# Patient Record
Sex: Female | Born: 1945 | Race: White | Hispanic: No
Health system: Southern US, Community
[De-identification: ages and names within clinical notes are randomized; demographics above are authoritative.]

---

## 2013-04-02 DIAGNOSIS — R911 Solitary pulmonary nodule: Secondary | ICD-10-CM | POA: Insufficient documentation

## 2018-10-17 ENCOUNTER — Ambulatory Visit (INDEPENDENT_AMBULATORY_CARE_PROVIDER_SITE_OTHER): Payer: Medicare Other | Admitting: Podiatry

## 2018-10-17 ENCOUNTER — Encounter: Payer: Self-pay | Admitting: Podiatry

## 2018-10-17 DIAGNOSIS — C3490 Malignant neoplasm of unspecified part of unspecified bronchus or lung: Secondary | ICD-10-CM | POA: Insufficient documentation

## 2018-10-17 DIAGNOSIS — E785 Hyperlipidemia, unspecified: Secondary | ICD-10-CM | POA: Insufficient documentation

## 2018-10-17 DIAGNOSIS — F32A Depression, unspecified: Secondary | ICD-10-CM | POA: Insufficient documentation

## 2018-10-17 DIAGNOSIS — K649 Unspecified hemorrhoids: Secondary | ICD-10-CM | POA: Insufficient documentation

## 2018-10-17 DIAGNOSIS — A310 Pulmonary mycobacterial infection: Secondary | ICD-10-CM | POA: Insufficient documentation

## 2018-10-17 DIAGNOSIS — I10 Essential (primary) hypertension: Secondary | ICD-10-CM | POA: Insufficient documentation

## 2018-10-17 DIAGNOSIS — R319 Hematuria, unspecified: Secondary | ICD-10-CM | POA: Insufficient documentation

## 2018-10-17 DIAGNOSIS — N952 Postmenopausal atrophic vaginitis: Secondary | ICD-10-CM | POA: Insufficient documentation

## 2018-10-17 DIAGNOSIS — F419 Anxiety disorder, unspecified: Secondary | ICD-10-CM | POA: Insufficient documentation

## 2018-10-17 DIAGNOSIS — F329 Major depressive disorder, single episode, unspecified: Secondary | ICD-10-CM | POA: Insufficient documentation

## 2018-10-17 DIAGNOSIS — M5090 Cervical disc disorder, unspecified, unspecified cervical region: Secondary | ICD-10-CM | POA: Insufficient documentation

## 2018-10-17 DIAGNOSIS — B070 Plantar wart: Secondary | ICD-10-CM | POA: Diagnosis not present

## 2018-10-19 NOTE — Progress Notes (Signed)
Subjective:   Patient ID: Laura Nguyen, female   DOB: 72 y.o.   MRN: [id number]   HPI 72 year old female presents the office today for concerns of a skin lesion to the bottom of her left foot.  She states that she was previously treated with " beetle juice" and helped quite a bit.  She then later had the wart removed and it came back on portion.  She was told that by her other doctor they did not have the medication she has not had any recent treatment.  There is discomfort with pressure.  Denies any redness or drainage.  She gets occasional swelling to the area.  No redness or warmth.   Review of Systems  All other systems reviewed and are negative.  No past medical history on file.     Current Outpatient Medications:  .  cyclobenzaprine (FLEXERIL) 5 MG tablet, cyclobenzaprine 5 mg tablet, Disp: , Rfl:  .  DULoxetine (CYMBALTA) 30 MG capsule, duloxetine 30 mg capsule,delayed release, Disp: , Rfl:  .  ezetimibe (ZETIA) 10 MG tablet, ezetimibe 10 mg tablet, Disp: , Rfl:  .  hydrocortisone (ANUSOL-HC) 2.5 % rectal cream, Anusol-HC 2.5 % rectal cream with applicator  Insert by rectal route., Disp: , Rfl:  .  hydrOXYzine (ATARAX/VISTARIL) 25 MG tablet, hydroxyzine HCl 25 mg tablet, Disp: , Rfl:  .  imiquimod (ALDARA) 5 % cream, APPLY TO AFFECTED AREAS ON MONDAY, WEDNESDAY AND FRIDAY AS DIRECTED, Disp: , Rfl:  .  pneumococcal 13-valent conjugate vaccine (PREVNAR 13) SUSP injection, ADM 0.5ML IM UTD, Disp: , Rfl:  .  telmisartan (MICARDIS) 80 MG tablet, telmisartan 80 mg tablet, Disp: , Rfl:  .  ALPRAZolam (XANAX) 0.5 MG tablet, alprazolam 0.5 mg tabs, Disp: , Rfl:  .  amLODipine (NORVASC) 2.5 MG tablet, amlodipine besylate 2.5 mg tabs, Disp: , Rfl:  .  atorvastatin (LIPITOR) 40 MG tablet, atorvastatin calcium 40 mg tabs, Disp: , Rfl:  .  azithromycin (ZITHROMAX) 250 MG tablet, azithromycin 250 mg tabs, Disp: , Rfl:  .  FLUAD 0.5 ML SUSY, ADM 0.5ML IM UTD, Disp: , Rfl: 0 .   hydrochlorothiazide (HYDRODIURIL) 12.5 MG tablet, hydrochlorothiazide 12.5 mg tabs, Disp: , Rfl:  .  metoprolol succinate (TOPROL-XL) 25 MG 24 hr tablet, metoprolol succinate ER 25 mg tablet,extended release 24 hr, Disp: , Rfl:   Allergies  Allergen Reactions  . Codeine Nausea Only  . Lisinopril Rash  . Oxycodone-Acetaminophen Nausea Only and Nausea And Vomiting    Other reaction(s): Vomiting   . Sulfa Antibiotics Nausea Only and Rash         Objective:  Physical Exam  General: AAO x3, NAD  Dermatological: Hyperkeratotic lesion left foot submetatarsal 3 area.  There is discomfort to palpation from the area and upon debridement there is evidence of recurrent verruca.  She had quite a bit of hyperkeratotic tissue along the area as well.  Minimal edema there is no surrounding erythema, fluctuation or crepitation and there is no ascending cellulitis.  No other lesions identified  Vascular: Dorsalis Pedis artery and Posterior Tibial artery pedal pulses are 2/4 bilateral with immedate capillary fill time. P. There is no pain with calf compression, swelling, warmth, erythema.   Neruologic: Grossly intact via light touch bilateral.  Protective threshold with Semmes Wienstein monofilament intact to all pedal sites bilateral.   Musculoskeletal: Tenderness the hyperkeratotic lesion but no other areas of tenderness.  Muscular strength 5/5 in all groups tested bilateral.  Gait: Unassisted, Nonantalgic.  Assessment:   Skin lesion left foot likely verruca     Plan:  -Treatment options discussed including all alternatives, risks, and complications -Etiology of symptoms were discussed -Lesion was sharply debrided without any complications.  The area was cleaned with alcohol.  Cantharone was applied followed by an occlusive bandage.  Post procedure instructions were discussed.  Monitoring signs or symptoms of infection.  Offloading pads were dispensed.  Trula Slade DPM

## 2018-11-06 ENCOUNTER — Ambulatory Visit: Payer: Medicare Other | Admitting: Podiatry

## 2018-11-07 ENCOUNTER — Encounter: Payer: Self-pay | Admitting: Podiatry

## 2018-11-07 ENCOUNTER — Ambulatory Visit (INDEPENDENT_AMBULATORY_CARE_PROVIDER_SITE_OTHER): Payer: Medicare Other | Admitting: Podiatry

## 2018-11-07 DIAGNOSIS — B070 Plantar wart: Secondary | ICD-10-CM

## 2018-11-07 NOTE — Patient Instructions (Signed)
Take dressing off in 8 hours and wash the foot with soap and water. If it is hurting or becomes uncomfortable before the 8 hours, go ahead and remove the bandage and wash the area.  If it blisters, apply antibiotic ointment and a band-aid.  Monitor for any signs/symptoms of infection. Call the office immediately if any occur or go directly to the emergency room. Call with any questions/concerns.   

## 2018-11-11 NOTE — Progress Notes (Signed)
Subjective: 72 year old female presents the office today for Evaluation for keratosis, verruca left submetatarsal area.  She says it was somewhat sore after the treatment with the [facility] last appointment but otherwise is doing better but is still present.  Denies any redness or drainage or any swelling.  No other concerns no changes the last saw her. Denies any systemic complaints such as fevers, chills, nausea, vomiting. No acute changes since last appointment, and no other complaints at this time.   Objective: AAO x3, NAD DP/PT pulses palpable bilaterally, CRT less than 3 seconds Hyperkeratotic lesion left foot submetatarsal 3 area.  He appears to be porokeratosis overall today improved.  There is no edema, erythema.  There is no drainage or pus. No open lesions or pre-ulcerative lesions.  No pain with calf compression, swelling, warmth, erythema  Assessment: Skin lesion left foot  Plan: -All treatment options discussed with the patient including all alternatives, risks, complications.  -The lesion was cleaned with alcohol and is debrided with a #312 with scalpel down to healthy tissue.  All areas cleaned with alcohol and Cantharone was applied followed by an occlusive bandage.  Post procedure sessions were discussed.  Monitor for infection. -RTC 3 weeks or sooner if needed -Patient encouraged to call the office with any questions, concerns, change in symptoms.   Trula Slade DPM

## 2018-12-01 ENCOUNTER — Encounter: Payer: Self-pay | Admitting: Podiatry

## 2018-12-01 ENCOUNTER — Ambulatory Visit (INDEPENDENT_AMBULATORY_CARE_PROVIDER_SITE_OTHER): Payer: Medicare Other | Admitting: Podiatry

## 2018-12-01 DIAGNOSIS — Q828 Other specified congenital malformations of skin: Secondary | ICD-10-CM | POA: Diagnosis not present

## 2018-12-01 NOTE — Progress Notes (Signed)
Subjective: 72 year old female presents the office today for evaluation for porokeratosis left foot. She states that she is doing well.  She was to have another treatment of the cancer care today as she is doing much better.  She denies any complications of the last treatment.  Denies any redness or drainage or any swelling.  She has no other concerns. Denies any systemic complaints such as fevers, chills, nausea, vomiting. No acute changes since last appointment, and no other complaints at this time.   Objective: AAO x3, NAD DP/PT pulses palpable bilaterally, CRT less than 3 seconds Hyperkeratotic lesion left foot submetatarsal 3 area. After debridement the area appears to be much improved and the skin is intact.  Very minimal amount of the lesion remains.  There is no edema, erythema.  There is no drainage or pus. No open lesions or pre-ulcerative lesions.  No pain with calf compression, swelling, warmth, erythema  Assessment: Skin lesion left foot-with improvement  Plan: -All treatment options discussed with the patient including all alternatives, risks, complications.  -The lesion was cleaned with alcohol and is debrided with a #312 with scalpel down to healthy tissue.  All areas cleaned with alcohol and Cantharone was applied followed by an occlusive bandage.  Post procedure sessions were discussed.  Monitor for infection. -RTC 3 weeks or sooner if needed -Patient encouraged to call the office with any questions, concerns, change in symptoms.   Trula Slade DPM

## 2018-12-04 ENCOUNTER — Ambulatory Visit: Payer: Medicare Other | Admitting: Podiatry

## 2018-12-25 ENCOUNTER — Ambulatory Visit: Payer: Medicare Other | Admitting: Podiatry

## 2020-07-14 ENCOUNTER — Ambulatory Visit: Payer: Medicare Other | Admitting: Podiatry

## 2021-08-09 ENCOUNTER — Ambulatory Visit: Payer: Medicare Other | Admitting: Podiatry

## 2021-08-15 ENCOUNTER — Encounter: Payer: Self-pay | Admitting: Podiatry

## 2021-08-15 ENCOUNTER — Other Ambulatory Visit: Payer: Self-pay

## 2021-08-15 ENCOUNTER — Ambulatory Visit (INDEPENDENT_AMBULATORY_CARE_PROVIDER_SITE_OTHER): Payer: Medicare Other | Admitting: Podiatry

## 2021-08-15 DIAGNOSIS — B079 Viral wart, unspecified: Secondary | ICD-10-CM | POA: Diagnosis not present

## 2021-08-15 NOTE — Patient Instructions (Signed)
Take dressing off in 8 hours and wash the foot with soap and water. If it is hurting or becomes uncomfortable before the 8 hours, go ahead and remove the bandage and wash the area.  If it blisters, apply antibiotic ointment and a band-aid.  Monitor for any signs/symptoms of infection. Call the office immediately if any occur or go directly to the emergency room. Call with any questions/concerns.   

## 2021-08-22 NOTE — Progress Notes (Signed)
Subjective: 75 year old female presents the office today for concerns of reoccurrence of warts on the bottom of her left foot.  She states they did go away however they have started come back.  No significant pain with them and denies any swelling or redness or any drainage.  No recent treatment otherwise.  No other concerns.  Objective: AAO x3, NAD DP/PT pulses palpable bilaterally, CRT less than 3 seconds On the plantar aspect left foot are 2 hyperkeratotic lesions with pinpoint bleeding evidence of verruca.  There is no edema, erythema, drainage or pus or any signs of infection.  No significant pain.  No foreign body or puncture wound noted.  No open lesions or pre-ulcerative lesions.  No pain with calf compression, swelling, warmth, erythema  Assessment: Verruca  Plan: -All treatment options discussed with the patient including all alternatives, risks, complications.  -Skin lesions were cleaned.  This sharply debrided without any complications.  Cantharone was applied followed an occlusive bandage.  Post procedure instructions discussed.  Monitor for any signs or symptoms of infection. -Patient encouraged to call the office with any questions, concerns, change in symptoms.   Return in about 4 weeks (around 09/12/2021).  Trula Slade DPM

## 2021-09-12 ENCOUNTER — Ambulatory Visit (INDEPENDENT_AMBULATORY_CARE_PROVIDER_SITE_OTHER): Payer: Medicare Other | Admitting: Podiatry

## 2021-09-12 ENCOUNTER — Other Ambulatory Visit: Payer: Self-pay

## 2021-09-12 DIAGNOSIS — B079 Viral wart, unspecified: Secondary | ICD-10-CM

## 2021-09-12 NOTE — Patient Instructions (Signed)
Keep the bandage on for 24 hours. At that time, remove and clean with soap and water. If it hurts or burns before 24 hours go ahead and remove the bandage and wash with soap and water. Keep the area clean. If there is any blistering cover with antibiotic ointment and a bandage. Monitor for any redness, drainage, or other signs of infection. Call the office if any are to occur. If you have any questions, please call the office at [phone].  

## 2021-09-17 NOTE — Progress Notes (Signed)
Subjective: 75 year old female presents the office today for follow evaluation of wart on the left foot.  She had 2 lesions at last appointment and 1 has resolved 1 is still remaining.  No pain, swelling or redness or any drainage.  She has no other concerns.  Objective: AAO x3, NAD DP/PT pulses palpable bilaterally, CRT less than 3 seconds On the left foot submetatarsal is 1 area of hyperkeratotic tissue with central verrucoid noted.  There is no edema, erythema or any sign of any signs of infection.  Fifth of lesion appears to be resolved.  No other open lesions were skin lesions noted. No pain with calf compression, swelling, warmth, erythema  Assessment: Verruca x1 left foot  Plan: -All treatment options discussed with the patient including all alternatives, risks, complications.  -Sharply debrided lesion without any complications or bleeding.  Does appear to be better but still evident and there is more callus formation.  Today I applied salicylic acid.  I sharply debrided the lesion without any complications or bleeding.  Skin was cleaned with alcohol and a pad was placed followed by salicylic acid and a bandage.  Post procedure instructions discussed. -Patient encouraged to call the office with any questions, concerns, change in symptoms.   Trula Slade DPM

## 2021-12-25 ENCOUNTER — Other Ambulatory Visit: Payer: Self-pay

## 2021-12-25 ENCOUNTER — Emergency Department (HOSPITAL_BASED_OUTPATIENT_CLINIC_OR_DEPARTMENT_OTHER): Payer: Medicare Other | Admitting: Radiology

## 2021-12-25 ENCOUNTER — Encounter (HOSPITAL_BASED_OUTPATIENT_CLINIC_OR_DEPARTMENT_OTHER): Payer: Self-pay | Admitting: Obstetrics and Gynecology

## 2021-12-25 ENCOUNTER — Emergency Department (HOSPITAL_BASED_OUTPATIENT_CLINIC_OR_DEPARTMENT_OTHER)
Admission: EM | Admit: 2021-12-25 | Discharge: 2021-12-25 | Disposition: A | Discharge status: Home / Self Care | Payer: Medicare Other | Attending: Emergency Medicine | Admitting: Emergency Medicine

## 2021-12-25 DIAGNOSIS — S6991XA Unspecified injury of right wrist, hand and finger(s), initial encounter: Secondary | ICD-10-CM | POA: Diagnosis present

## 2021-12-25 DIAGNOSIS — S66911A Strain of unspecified muscle, fascia and tendon at wrist and hand level, right hand, initial encounter: Secondary | ICD-10-CM | POA: Insufficient documentation

## 2021-12-25 DIAGNOSIS — I10 Essential (primary) hypertension: Secondary | ICD-10-CM | POA: Diagnosis not present

## 2021-12-25 DIAGNOSIS — Z79899 Other long term (current) drug therapy: Secondary | ICD-10-CM | POA: Insufficient documentation

## 2021-12-25 DIAGNOSIS — Z85118 Personal history of other malignant neoplasm of bronchus and lung: Secondary | ICD-10-CM | POA: Insufficient documentation

## 2021-12-25 DIAGNOSIS — W19XXXA Unspecified fall, initial encounter: Secondary | ICD-10-CM | POA: Diagnosis not present

## 2021-12-25 NOTE — ED Triage Notes (Signed)
Patient reports she tripped last night and hurt her right wrist. Patient reports she did not hit her head, no LOC. Patient reports her right wrist is swollen and painful to touch

## 2021-12-25 NOTE — Discharge Instructions (Addendum)
Your xray was negative for signs of fracture today, so you likely have some bruising and a wrist strain. Apply a compressive ACE bandage - you can pick up a wrist sleeve at [facility] that may be more comfortable for you.Rest and elevate the affected painful area.  Apply cold compresses intermittently as needed for approximately 15-20 minutes as a time. Take Tylenol and motrin as needed for pain. You can use Aleve instead of motrin, but please don't use both at the same time. As pain recedes, begin normal activities slowly as tolerated.  Call if symptoms persist. You can have this rechecked at your PCP appointment next week.

## 2021-12-27 NOTE — ED Provider Notes (Signed)
[facility] EMERGENCY DEPT Provider Note   CSN: [id number] Arrival date & time: 12/25/21  1224     History Chief Complaint  Patient presents with   Lytle Michaels    Laura Nguyen is a 75 y.o. female who presents for evaluation of a right wrist injury that occurred last night.  [place] injury.  Patient has decreased range of motion secondary to pain, she is concerned that it is swollen painful touch.  She denies tendon injury or loss of consciousness.  She denies numbness, tingling.  Pain worse with movement.  She has taken ibuprofen without relief.  She has no other complaints.   [place] Pertinent negatives include no abdominal pain, no headaches and no shortness of breath.      History reviewed. No pertinent past medical history.  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 No pertinent surgical history.   OB History   No obstetric history on file.     No family history on file.  Social History   Tobacco Use   Smoking status: Unknown   Smokeless tobacco: Never  Vaping Use   Vaping Use: Never used  Substance Use Topics   Alcohol use: Yes    Comment: Social   Drug use: Never    Home Medications Prior to Admission medications   Medication Sig Start Date End Date Taking? Authorizing Provider  ALPRAZolam (XANAX) 0.5 MG tablet alprazolam 0.5 mg tabs    [provider]  amLODipine (NORVASC) 2.5 MG tablet amlodipine besylate 2.5 mg tabs    [provider]  atorvastatin (LIPITOR) 40 MG tablet atorvastatin calcium 40 mg tabs    [provider]  cyclobenzaprine (FLEXERIL) 5  MG tablet cyclobenzaprine 5 mg tablet 02/17/18   [provider]  DULoxetine (CYMBALTA) 30 MG capsule duloxetine 30 mg capsule,delayed release 02/17/18   [provider]  ezetimibe (ZETIA) 10 MG tablet ezetimibe 10 mg tablet 02/28/09   [provider]  FLUAD 0.5 ML SUSY ADM 0.5ML IM UTD 09/10/18   [provider]  hydrochlorothiazide (HYDRODIURIL) 12.5 MG tablet hydrochlorothiazide 12.5 mg tabs    [provider]  hydrOXYzine (ATARAX/VISTARIL) 25 MG tablet hydroxyzine HCl 25 mg tablet 02/17/18   [provider]  imiquimod (ALDARA) 5 % cream APPLY TO AFFECTED AREAS ON MONDAY, [place] AS DIRECTED 04/28/18   [provider]  metoprolol succinate (TOPROL-XL) 25 MG 24 hr tablet metoprolol succinate ER 25 mg tablet,extended release 24 hr    [provider]  pneumococcal 13-valent conjugate vaccine (PREVNAR 13) SUSP injection ADM 0.5ML IM UTD 10/02/15   [provider]  telmisartan (MICARDIS) 80 MG tablet telmisartan 80 mg tablet 08/18/08   [provider]    Allergies    Codeine, Lisinopril, Oxycodone-acetaminophen, and Sulfa antibiotics  Review of Systems   Review of Systems  Constitutional:  Negative for fever.  HENT: Negative.    Eyes: Negative.   Respiratory:  Negative for shortness of breath.   Cardiovascular: Negative.   Gastrointestinal:  Negative for abdominal pain and vomiting.  Endocrine: Negative.   Genitourinary: Negative.   Musculoskeletal:  Positive for arthralgias and  joint swelling.  Skin:  Negative for rash.  Neurological:  Negative for headaches.  All other systems reviewed and are negative.  Physical Exam Updated Vital Signs BP (!) 149/65    Pulse 62    Temp 98.9 F (37.2 C)    Resp 16    Ht 5\' 1"  (1.549 m)    Wt 56.2 kg    SpO2 97%    BMI 23.43 kg/m   Physical Exam Vitals and nursing note reviewed.  Constitutional:      General: She is not in acute distress.     Appearance: She is not ill-appearing.  HENT:     Head: Atraumatic.  Eyes:     Conjunctiva/sclera: Conjunctivae normal.  Cardiovascular:     Rate and Rhythm: Normal rate and regular rhythm.     Pulses: Normal pulses.     Heart sounds: No murmur heard. Pulmonary:     Effort: Pulmonary effort is normal. No respiratory distress.     Breath sounds: Normal breath sounds.  Abdominal:     General: Abdomen is flat. There is no distension.     Palpations: Abdomen is soft.     Tenderness: There is no abdominal tenderness.  Musculoskeletal:        General: Normal range of motion.     Cervical back: Normal range of motion.     Comments: Right wrist is swollen and bruised.  Active range of motion is limited due to pain.  She does have full passive ROM despite discomfort.  Good thumb pinky opposition.  Sensation intact.  No palpable deformity or crepitus.  Skin:    General: Skin is warm and dry.     Capillary Refill: Capillary refill takes less than 2 seconds.  Neurological:     General: No focal deficit present.     Mental Status: She is alert.  Psychiatric:        Mood and Affect: Mood normal.    ED Results / Procedures / Treatments   Labs (all labs ordered are listed, but only abnormal results are displayed) Labs Reviewed - No data to display  EKG None  Radiology No results found.  Procedures Procedures   Medications Ordered in ED Medications - No data to display  ED Course  I have reviewed the triage vital signs and the nursing notes.  Pertinent labs & imaging results that were available during my care of the patient were reviewed by me and considered in my medical decision making (see chart for details).    MDM Rules/Calculators/A&P                         This is a 75 year old female presents for right wrist injury.  This complaint carries with it a high risk for morbidity and loss of limb function.  The differential for this complaint includes fracture, sprain,  hematoma.  Physical exam was reassuring.  Wrist x-ray was negative for signs of fracture.  They did note some calcification from likely previous injury.  I independently reviewed and interpreted imaging myself and agree with the radiologist interpretation.  I discussed results with patient.  Her injury is likely a wrist sprain with contusion.  RICE protocol indicated.  All questions asked and answered and patient agrees is amenable to discharge.  Final Clinical Impression(s) / ED Diagnoses Final diagnoses:  Wrist injury, right, initial encounter  Wrist strain, right, initial encounter    Rx / DC Orders ED Discharge Orders  None        Rodena Piety 12/27/21 2013    Lajean Saver, MD 12/29/21 [phone]

## 2022-02-15 ENCOUNTER — Ambulatory Visit: Payer: Medicare Other | Admitting: Podiatry

## 2022-02-16 ENCOUNTER — Ambulatory Visit (INDEPENDENT_AMBULATORY_CARE_PROVIDER_SITE_OTHER): Payer: Medicare Other | Admitting: Podiatry

## 2022-02-16 ENCOUNTER — Other Ambulatory Visit: Payer: Self-pay

## 2022-02-16 DIAGNOSIS — B079 Viral wart, unspecified: Secondary | ICD-10-CM

## 2022-02-20 NOTE — Progress Notes (Signed)
Subjective: 76 year old female presents the office today for follow evaluation of wart on the left foot.  She states the lesion is still present and has not changed.  She is interested in other treatment options.  Area is tender with pressure.  Denies any swelling or redness or any drainage.  She has no other concerns.    Objective: AAO x3, NAD DP/PT pulses palpable bilaterally, CRT less than 3 seconds On the left foot submetatarsal there is an area of hyperkeratotic tissue with central verrucoid noted.  There is no edema, erythema or any sign of any signs of infection.  Fifth of lesion appears to be resolved.  No other open lesions were skin lesions noted. No pain with calf compression, swelling, warmth, erythema  Assessment: Verruca x1 left foot  Plan: -All treatment options discussed with the patient including all alternatives, risks, complications.  -Sharply debrided lesion without any complications or bleeding.  Spoke with centimeter last appointment.  We discussed further conservative treatment versus surgical excision.  Today I sharply debrided the lesion with any complications.  I cleansed the area with alcohol and clean the area well.  The area was then lasered understand her precautions make sure to use protective eyewear with any complications.  I then applied a small amount of Cantharone.  Postprocedure instructions discussed.  Monitor for any signs or symptoms of infection. -If no improvement discussed excision in the office.  Trula Slade DPM

## 2022-03-05 ENCOUNTER — Encounter: Payer: Self-pay | Admitting: Podiatry

## 2022-03-05 ENCOUNTER — Other Ambulatory Visit: Payer: Self-pay

## 2022-03-05 ENCOUNTER — Ambulatory Visit (INDEPENDENT_AMBULATORY_CARE_PROVIDER_SITE_OTHER): Payer: Medicare Other | Admitting: Podiatry

## 2022-03-05 DIAGNOSIS — B079 Viral wart, unspecified: Secondary | ICD-10-CM | POA: Diagnosis not present

## 2022-03-05 NOTE — Patient Instructions (Signed)
Take dressing off in 8 hours and wash the foot with soap and water. If it is hurting or becomes uncomfortable before the 8 hours, go ahead and remove the bandage and wash the area.  If it blisters, apply antibiotic ointment and a band-aid.  Monitor for any signs/symptoms of infection. Call the office immediately if any occur or go directly to the emergency room. Call with any questions/concerns.   

## 2022-03-08 NOTE — Progress Notes (Signed)
Subjective: ?76 year old female presents the office today for follow evaluation of wart on the left foot.  She states it has not changed in the same.  No significant increase in pain and no swelling redness or any drainage.  Worsening either.  She had no Issues after the laser, Cantharone application last appointment.  No fevers or chills.  No other concerns.  ? ?Objective: ?AAO x3, NAD ?DP/PT pulses palpable bilaterally, CRT less than 3 seconds ?On the left foot submetatarsal there is an area of hyperkeratotic tissue with central verrucoid noted.  Initially it appeared to be improved.  Then after debridement it was callus without any hyperpigmented changes or any underlying ulceration.  No edema, erythema. ?No pain with calf compression, swelling, warmth, erythema ? ?Assessment: ?Verruca, skin lesion x 1 left foot ? ?Plan: ?-All treatment options discussed with the patient including all alternatives, risks, complications.  ?-Today I sharply debrided the lesion without any complications or bleeding was noted.  Was able to debride quite a bit of callus formation today.  After I debrided the superficial portion of the callus there is no remaining hyperpigmented changes and what I debrided.  Small mount of bleeding did occur.  Due to the bleeding I did not lasered today.  I did apply a small mount of Cantharone followed by a bandage.  Postprocedure instructions discussed for monitoring signs or symptoms of infection. ?-We discussed surgical excision, biopsy today but ultimately held off today on this.  ? ?Trula Slade DPM ? ?

## 2022-04-02 ENCOUNTER — Ambulatory Visit (INDEPENDENT_AMBULATORY_CARE_PROVIDER_SITE_OTHER): Payer: Medicare Other | Admitting: Podiatry

## 2022-04-02 DIAGNOSIS — B079 Viral wart, unspecified: Secondary | ICD-10-CM | POA: Diagnosis not present

## 2022-04-02 NOTE — Patient Instructions (Signed)
Wart Surgery-Directions for Home Care ? ?You will need: Dial antibacterial hand soap,  gauze,  Band-aids ? ?Keep the original bandage on until the following morning.  Bathe or shower with the bandage on allowing it to soak, so that when removed it won't stick to the wound. ?After showering or bathing, remove the old bandage and cleanse the area with Dial soap and water.  Place a few drops of Dial soap and water on a piece of guaze and gently scrub the area.  Dry with a clean piece of gauze. Or you may do an epsom salt soak in a tub of water. Which ever is easiest for you. ?Apply antibiotic cream (polysporin, triple antibiotic or similar) to the area and place a clean square gauze bandage over and cover with a band-aid. ?In the evening, add a few drops of Dial soap to a basin of lukewarm water and soak your foot for 15 minutes.  After soaking, follow the instructions above for cleaning the area. ?Continue cleansing the area as described above two times a day, applying sterile gauze dressings until the doctor informs you that it is not needed. ?The time required to heal the surgical site will depend upon the size and location of the wart.  Lesions under bony prominences heal slower.  The average healing time is 2 to 4 weeks. ?Take over the counter Ibuprofen or Tylenol as needed should you experience any discomfort ?If you do experience discomfort after surgery, keep the foot elevated and apply an ice pack over your ankle, 30 minutes on, 30 minutes off each hour for the rest of the day. ?If you have any questions , please do not hesitate to contact the office. ? ?WARTS (Verrucae) ? ?Warts are caused by a virus that has invaded the skin.  They are more common in young adults and children and a small percentage will resolve on their own.  There are many types of warts including mosaic warts (large flat), vulgaris (domed warts-have pearl like appearance), and plantar warts (flat or cauliflower like appearance). ? ?Warts  are highly contagious and may be picked up from any surface.  Warts thrive in a warm moist environment and are common near pools, showers, and locker room floors.  Any microscopic cut in the skin is where the virus enters and becomes a wart. ? ?Warts are very difficult to treat and get rid of.  Patience is necessary in the treatment of this virus.  It may take months to cure and different methods may have to be used to get rid of your wart. ? ?Standard Initial Treatment is: ?Periodic debridement of the wart and application of Canthacur to each lesion (a blistering agent that will slough off the warty skin) ?Dispensing of topical treatments/prescriptions to apply to the wart at home ? ?Other options include: ?Excision of the lesion-numbing the skin around the wart and cutting it out-requires daily soaks post-operatively and takes about 2-3 weeks to fully heal ?Excision with CO2 Laser-Performed at the surgical center your foot is numbed up and the lesions are all cut out and then lasered with a high power laser.  Very good for multiple warts that are resistant. ?Cimetidine (Tagamet)-Oral agent used in high does--has shown better results in children ? ?How do I apply the standard topical treatments? ? ?Salicylic Acid (Compound W wart remover liquid or gel-available at drug or grocery stores)-Apply a dime size thickness over the wart and cover with duct tape-apply at night so the medication does not  spread out to the good skin.  The skin will turn white and slowly blister off.  Use a pumice stone daily to remove the white skin as best you can.  If the skin gets too raw and painful, discontinue for a few days then resume. ?Aldara (Imiquimod)-this is an immune response modifier.  They come in little packets so try to get at least 2 days out of each packet if you can.  Apply a small amount to the lesion and cover with duct tape.  Do not rub it in-let it absorb on its own.  Good to apply each morning. ? ?Other Helpful  Hints: ?Wash shoes that can be washed in the washing machine 2-3 x per month with some bleach ?Use Lysol in shoes that cannot be washed and wipe out with a cloth 1 x per week-allow to dry for 8 hours before wearing again ?Use a bleach solution (1 part bleach to 3 parts water) in your tub or shower to reduce the spread of the virus to yourself and others ?Use aqua socks or clean sandals when at the pool or locker room to reduce the chance of picking up the virus or spreading it to others  ? ?

## 2022-04-09 ENCOUNTER — Encounter: Payer: Self-pay | Admitting: Podiatry

## 2022-04-09 NOTE — Progress Notes (Signed)
Subjective: ?76 year old female presents the office today for follow evaluation of wart on the left foot.  She states the area is still present and wants to proceed with having the lesions cut out.  No swelling or redness or any drainage that she reports.  No fevers or chills.  No other concerns. ? ?Objective: ?AAO x3, NAD ?DP/PT pulses palpable bilaterally, CRT less than 3 seconds ?On the left foot submetatarsal there are 2 separate areas of hyperkeratotic tissue with central verrucoid noted.  Initially it appeared to be improved.  Then after debridement it was callus without any hyperpigmented changes or any underlying ulceration.  No edema, erythema. ?No pain with calf compression, swelling, warmth, erythema ? ?Assessment: ?Verruca, skin lesion x 2 left foot ? ?Plan: ?-All treatment options discussed with the patient including all alternatives, risks, complications.  ?-Discussed both conservative as well as surgical options.  This time she wants to proceed with having them removed.  Consent was signed.  Skin was with alcohol and initially 1 cc lidocaine with epinephrine was infiltrated subdermally.  An additional 2 cc of lidocaine, Marcaine plain was infiltrated.  Skin was cleaned with Betadine and anesthesia achieved.  I utilized a 15 blade scalpel circumferentially excise both of the lesions today.  A curette was utilized to remove them.  Area.  Lesions were removed in total.  Phenol was applied and then irrigated with alcohol.  Silvadene was applied followed by a dressing.  Postprocedure instructions discussed.  Monitoring for any signs or symptoms of infection.  She tolerated the procedure well and complications. ? ?Trula Slade DPM ? ?

## 2022-04-16 ENCOUNTER — Ambulatory Visit (INDEPENDENT_AMBULATORY_CARE_PROVIDER_SITE_OTHER): Payer: Medicare Other | Admitting: Podiatry

## 2022-04-16 DIAGNOSIS — B079 Viral wart, unspecified: Secondary | ICD-10-CM | POA: Diagnosis not present

## 2022-04-16 NOTE — Progress Notes (Signed)
Subjective: ?76 year old female presents the office today for Evaluation undergoing wart excision left foot.  She states that she is still having tenderness to the area.  She is soaking Epsom salts and has been current antibiotic ointment and a bandage.  No drainage or pus that she reports. ? ?Objective: ?AAO x3, NAD ?DP/PT pulses palpable bilaterally, CRT less than 3 seconds ?On the plantar aspect left foot on the area the wart excision scabbing is present but there is no drainage or pus.  Trace edema.  No erythema or warmth.  No fluctuation or crepitation.  No signs of infection. ?No pain with calf compression, swelling, warmth, erythema ? ?Assessment: ?Wart left foot ? ?Plan: ?-All treatment options discussed with the patient including all alternatives, risks, complications.  ?-Recent underwent excision.  They appear to be healing well with her still some minimal edema and having tenderness.  Continue soaking Epsom salts or loose washing with soap and water daily.  Apply a small amount of antibiotic ointment and a bandage.  I dispensed offloading pads. ?-Monitor for any clinical signs or symptoms of infection and directed to call the office immediately should any occur or go to the ER. ?-Patient encouraged to call the office with any questions, concerns, change in symptoms.  ? ?Return in about 3 weeks (around 05/07/2022). ? ?Trula Slade DPM ? ?

## 2022-04-21 DIAGNOSIS — B079 Viral wart, unspecified: Secondary | ICD-10-CM | POA: Insufficient documentation

## 2022-05-10 ENCOUNTER — Ambulatory Visit (INDEPENDENT_AMBULATORY_CARE_PROVIDER_SITE_OTHER): Payer: Medicare Other | Admitting: Podiatry

## 2022-05-10 DIAGNOSIS — B079 Viral wart, unspecified: Secondary | ICD-10-CM

## 2022-05-10 MED ORDER — CEPHALEXIN 500 MG PO CAPS: 500.0000 mg | ORAL_CAPSULE | Freq: Three times a day (TID) | ORAL | 0 refills | Status: DC

## 2022-05-10 NOTE — Patient Instructions (Signed)

## 2022-05-11 ENCOUNTER — Telehealth: Payer: Self-pay

## 2022-05-11 NOTE — Telephone Encounter (Signed)
Patient called with questions about the compounded medication you have prescribed for her. She needs reassurance that it contains no products related to penicillin. Please call to advise ?Thanks ?

## 2022-05-12 ENCOUNTER — Encounter: Payer: Self-pay | Admitting: Podiatry

## 2022-05-12 NOTE — Progress Notes (Signed)
Subjective: ?76 year old female presents the office today after undergoing verruca, soft tissue mass excision plantar aspect left foot.  She states that she still having quite a bit discomfort and some difficulty putting weight on her foot.  She denies any drainage but has had some swelling.  No fevers or chills that she reports. ? ?Objective: ?AAO x3, NAD ?DP/PT pulses palpable bilaterally, CRT less than 3 seconds ?Hyperkeratotic lesions with a dried blood present on the ear at the 3 excisions.  The 2 smaller lesions, excisions appear to be healed however along the larger area that was removed after I debrided the hyperkeratotic tissue there was wound still present underneath the center.  I had callused over.  This is likely was causing her pain.  Small mount of clear drainage there is no frank purulence.  There is minimal edema.  There is no erythema.  No fluctuation or crepitation.  No pain with calf compression, swelling, warmth, erythema ? ?Assessment: ?76 year old female with wound left foot ? ?Plan: ?-All treatment options discussed with the patient including all alternatives, risks, complications.  ?-Extremity with hyperkeratotic tissue.  Given the remaining hyperkeratotic tissue I also ordered a compound cream through [facility] to include salicylic acid as well as urea to apply around the area of the wound to the other callus formation but do not apply directly to the wound.  Was able to sharply debride the hyperkeratotic tissue to reveal the underlying ulceration that is still present.  I prescribed cephalexin originally as I did not have a penicillin allergy documented in the chart.  When she picked this up is when it was noticed.  Her doctor prescribed doxycycline.  Recommend antibiotic ointment dressing changes to the wound. ?-Monitor for any clinical signs or symptoms of infection and directed to call the office immediately should any occur or go to the ER. ?-Patient encouraged to call the  office with any questions, concerns, change in symptoms.  ? ?Return in about 2 weeks (around 05/24/2022) for skin lesion, infection . ? ?Trula Slade DPM ? ?

## 2022-05-24 ENCOUNTER — Ambulatory Visit: Payer: Medicare Other | Admitting: Podiatry

## 2022-06-07 ENCOUNTER — Ambulatory Visit (INDEPENDENT_AMBULATORY_CARE_PROVIDER_SITE_OTHER): Payer: Medicare Other | Admitting: Podiatry

## 2022-06-07 DIAGNOSIS — B079 Viral wart, unspecified: Secondary | ICD-10-CM

## 2022-06-10 NOTE — Progress Notes (Signed)
Subjective: 76 year old female presents the office today after undergoing verruca, soft tissue mass excision plantar aspect left foot.  She states that she is doing better but the area is still present.  Denies any drainage or pus or any swelling or redness.  The wound is healed and she started using the compound cream through [place] for the wart.  Objective: AAO x3, NAD DP/PT pulses palpable bilaterally, CRT less than 3 seconds Aspect left foot there is 1 central annular hyperkeratotic lesion still remaining.  Ulceration appears to be healed there is no edema, erythema or signs of infection.  Overall the tenderness is improved. No pain with calf compression, swelling, warmth, erythema  Assessment: 76 year old female with wound left foot  Plan: -All treatment options discussed with the patient including all alternatives, risks, complications.  -Sharply debrided the hyperkeratotic lesion without any complications or bleeding.  The wound is healed.  Cantharone Plus was applied followed by a bandage.  Postprocedure instructions discussed.  Monitor for any signs or symptoms of infection. -Continue topical compound cream as well we can switch to different medication.  Trula Slade DPM

## 2022-07-19 ENCOUNTER — Ambulatory Visit (INDEPENDENT_AMBULATORY_CARE_PROVIDER_SITE_OTHER): Payer: Medicare Other | Admitting: Podiatry

## 2022-07-19 DIAGNOSIS — D2371 Other benign neoplasm of skin of right lower limb, including hip: Secondary | ICD-10-CM

## 2022-07-19 DIAGNOSIS — D492 Neoplasm of unspecified behavior of bone, soft tissue, and skin: Secondary | ICD-10-CM

## 2022-07-19 NOTE — Patient Instructions (Signed)
Take dressing off in 8 hours and wash the foot with soap and water. If it is hurting or becomes uncomfortable before the 8 hours, go ahead and remove the bandage and wash the area.  If it blisters, apply antibiotic ointment and a band-aid.  Monitor for any signs/symptoms of infection. Call the office immediately if any occur or go directly to the emergency room. Call with any questions/concerns.   

## 2022-07-22 NOTE — Progress Notes (Signed)
Subjective: 76 year old female presents the office today after undergoing verruca, soft tissue mass excision plantar aspect left foot.  States that she is doing better but still has 1 small spot of callus formed.  No edema, erythema or signs of infection.  She still using the compound cream through [employer].  No fevers or chills.   Objective: AAO x3, NAD DP/PT pulses palpable bilaterally, CRT less than 3 seconds Aspect left foot there is 1 central annular hyperkeratotic lesion still remaining.  Hyperkeratotic tissue present and upon debridement there is 1 pinpoint opening that appears to be almost resolved at this time.  There is no edema, erythema or signs of infection. No pain with calf compression, swelling, warmth, erythema  Assessment: 76 year old female with verruca left foot  Plan: -All treatment options discussed with the patient including all alternatives, risks, complications.  -Sharply debrided the hyperkeratotic lesion without any complications or bleeding.  No ulceration or signs of infection today.  Cantharone Plus was applied followed by a bandage.  Postprocedure instructions discussed.  Monitor for any signs or symptoms of infection. -Continue topical compound cream as well we can switch to different medication.  Trula Slade DPM

## 2023-08-08 IMAGING — DX DG WRIST COMPLETE 3+V*R*
4 series · 4 of 4 positions shown · non-contrast
Comparison: None.

CLINICAL DATA: Trauma, pain

EXAM:
RIGHT WRIST - COMPLETE 3+ VIEW

[wrist ap]
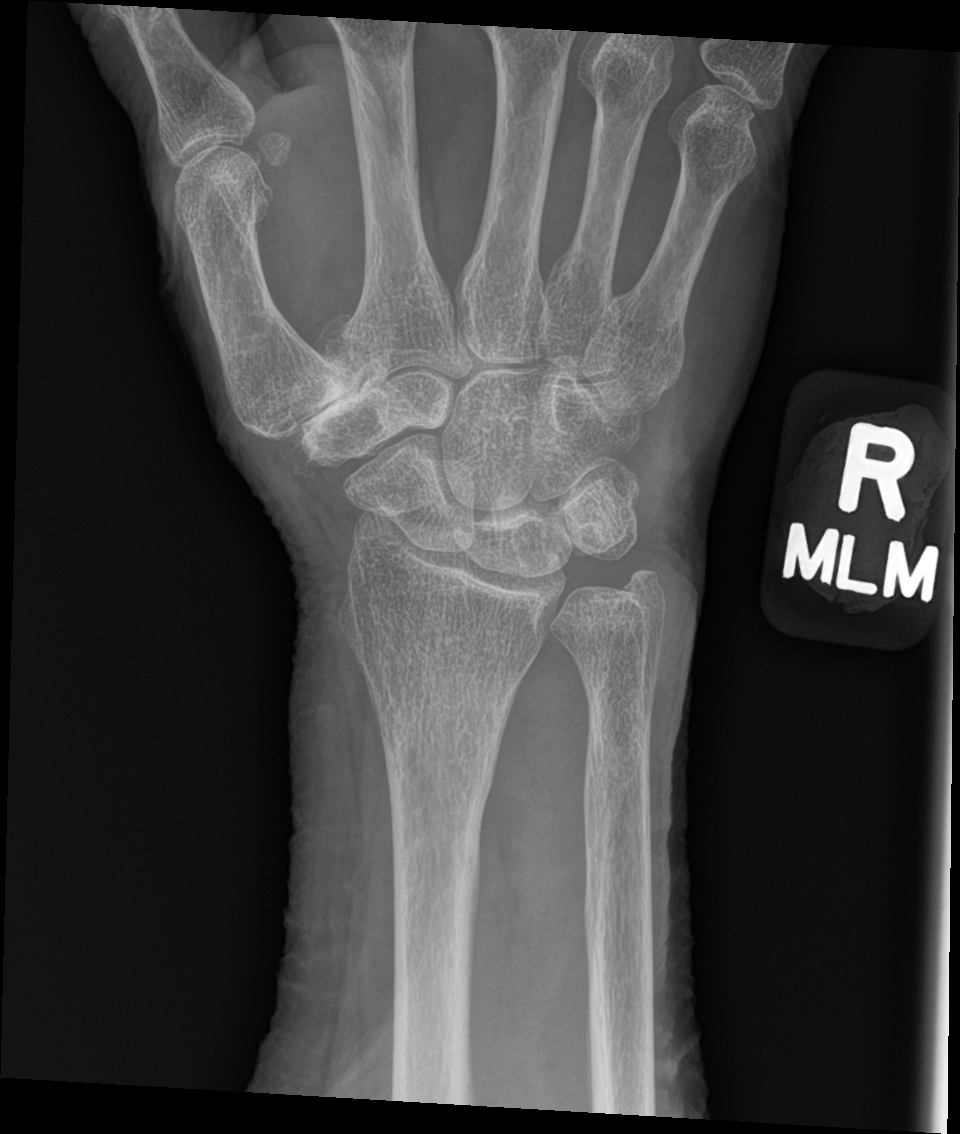

[wrist obl]
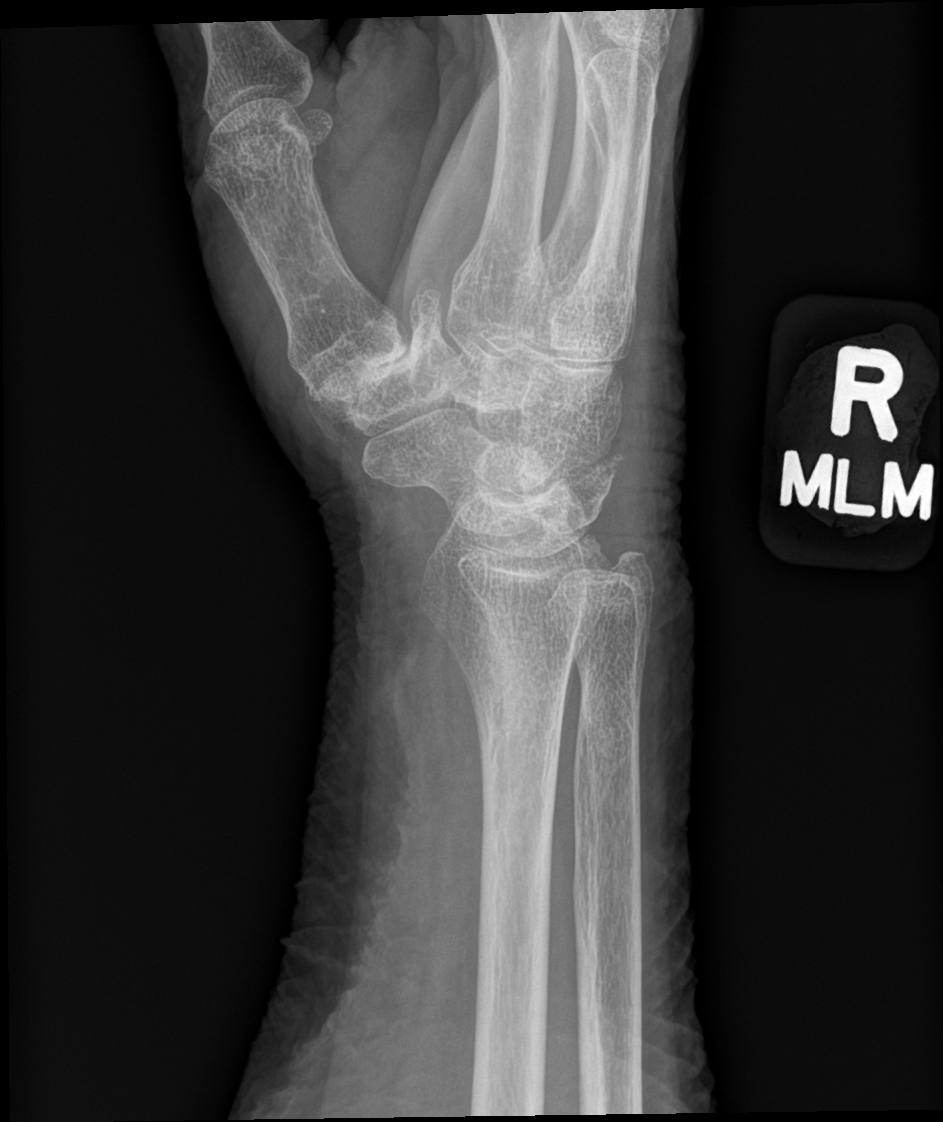

[wrist lat]
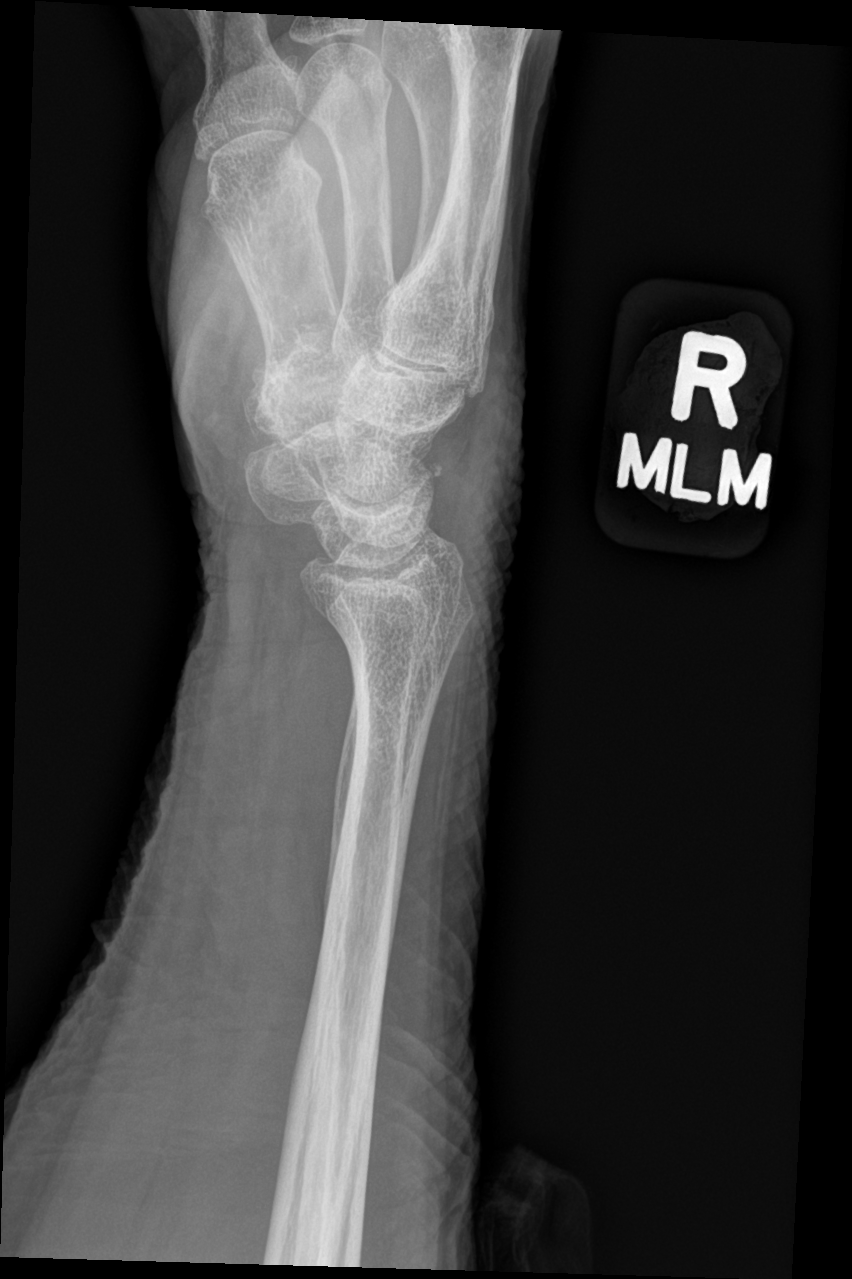

[wrist navicular]
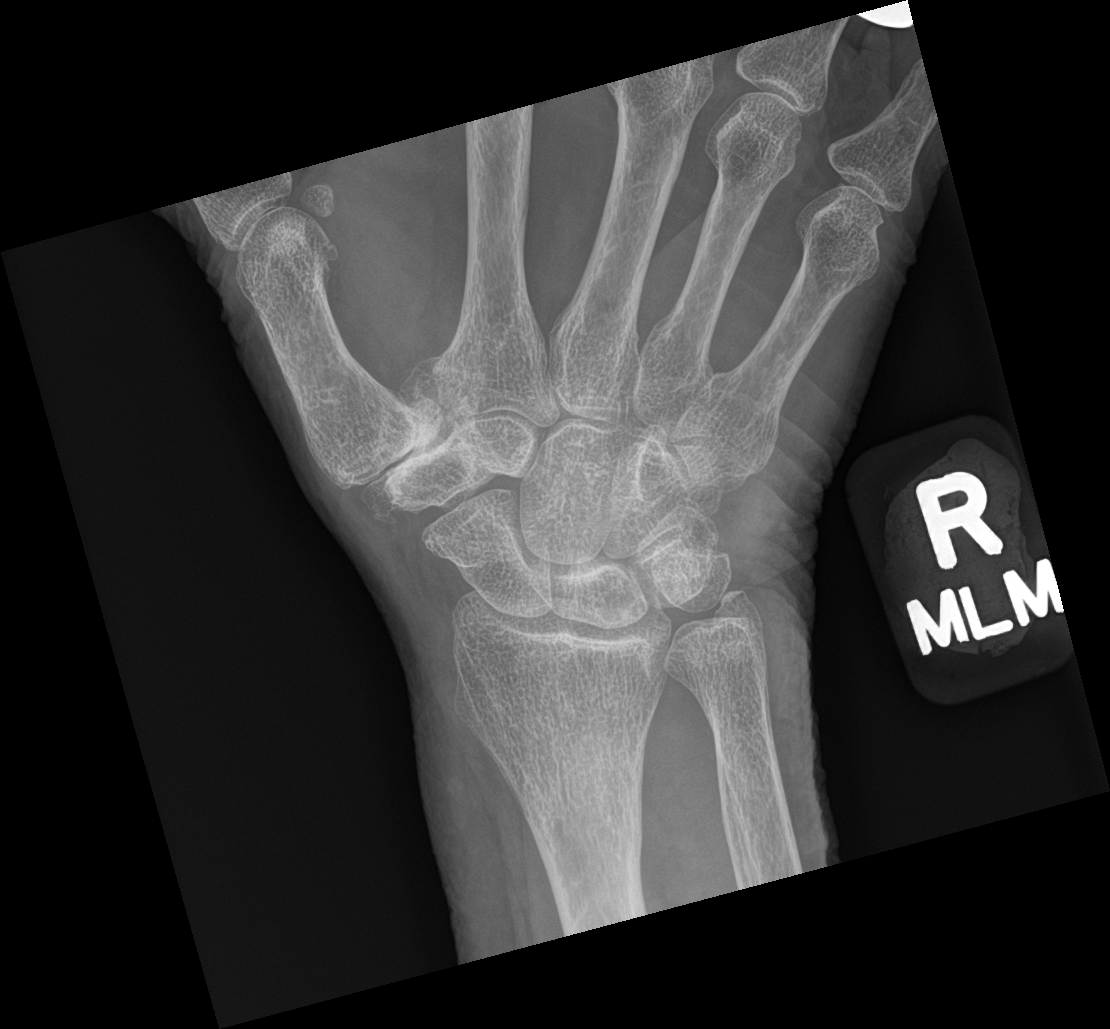

[4 of 4 positions shown; findings below may reference images not displayed]

FINDINGS: No recent displaced fracture or dislocation is seen. Osteopenia is
seen in bony structures. In the lateral view, there is 2 mm smooth
marginated calcification in the dorsal aspect of the wrist, possibly
residual from previous injury. Degenerative changes are noted in the
first carpometacarpal joint with joint space narrowing and bony
spurs.
IMPRESSION: No recent displaced fracture or dislocation is seen in the right
wrist. 2 mm smooth marginated calcification in the dorsal aspect of
wrist may be residual from previous injury. Less likely possibility
would be recent avulsion.

Marked degenerative changes are noted in first carpometacarpal
joint.
